# Patient Record
Sex: Male | Born: 1995 | Race: Black or African American | Hispanic: No | Marital: Single | Smoking: Never smoker
Health system: Southern US, Community
[De-identification: ages and names within clinical notes are randomized; demographics above are authoritative.]

## PROBLEM LIST (undated history)

## (undated) HISTORY — PX: LEG / ANKLE SOFT TISSUE BIOPSY: SUR148

---

## 2013-12-22 ENCOUNTER — Emergency Department (HOSPITAL_COMMUNITY): Payer: BC Managed Care – PPO

## 2013-12-22 ENCOUNTER — Emergency Department (HOSPITAL_COMMUNITY)
Admission: EM | Admit: 2013-12-22 | Discharge: 2013-12-22 | Disposition: A | Discharge status: Home / Self Care | Payer: BC Managed Care – PPO | Attending: Emergency Medicine | Admitting: Emergency Medicine

## 2013-12-22 ENCOUNTER — Encounter (HOSPITAL_COMMUNITY): Payer: Self-pay | Admitting: Emergency Medicine

## 2013-12-22 DIAGNOSIS — R059 Cough, unspecified: Secondary | ICD-10-CM

## 2013-12-22 DIAGNOSIS — Z880 Allergy status to penicillin: Secondary | ICD-10-CM | POA: Diagnosis not present

## 2013-12-22 DIAGNOSIS — M791 Myalgia: Secondary | ICD-10-CM | POA: Insufficient documentation

## 2013-12-22 DIAGNOSIS — R0789 Other chest pain: Secondary | ICD-10-CM | POA: Diagnosis not present

## 2013-12-22 DIAGNOSIS — R05 Cough: Secondary | ICD-10-CM | POA: Diagnosis not present

## 2013-12-22 MED ORDER — GUAIFENESIN 100 MG/5ML PO LIQD: 100.0000 mg | ORAL | Status: AC | PRN

## 2013-12-22 MED ORDER — ALBUTEROL SULFATE HFA 108 (90 BASE) MCG/ACT IN AERS
2.0000 | INHALATION_SPRAY | Freq: Once | RESPIRATORY_TRACT | Status: AC
  Administered 2013-12-22: 2 via RESPIRATORY_TRACT
  Filled 2013-12-22: qty 6.7

## 2013-12-22 MED ORDER — IBUPROFEN 800 MG PO TABS: 800.0000 mg | ORAL_TABLET | Freq: Three times a day (TID) | ORAL | Status: AC

## 2013-12-22 MED ORDER — METHOCARBAMOL 500 MG PO TABS: 500.0000 mg | ORAL_TABLET | Freq: Two times a day (BID) | ORAL | Status: AC

## 2013-12-22 NOTE — ED Provider Notes (Signed)
CSN: [id number]     Arrival date & time 12/22/13  2011 History  This chart was scribed for Jeannetta EllisJennifer L Wrangler Penning, PA-C working with Gerhard Munchobert Lockwood, MD by Evon Slackerrance Branch, ED Scribe. This patient was seen in room WTR7/WTR7 and the patient's care was started at 9:14 PM.      Chief Complaint  Patient presents with  . Cough   Patient is a 18 y.o. male presenting with cough. The history is provided by the patient. No language interpreter was used.  Cough Associated symptoms: myalgias    HPI Comments: Mitchell Sandoval is a 18 y.o. male who presents to the Emergency Department complaining of cough onset 3 weeks ago. Pt states that he has associated chest pain over the past week, associated with his cough. He states that he also had some intermittent dizziness today at work after coughing.  He states that he has tried C[employer] manageralk[employer] seltzer with no relief. He states that he has tried Mucinex that provided relief for his intitial symptoms of rhinorrhea and congestion. Denies fever, chills, vomiting, diarrhea or abdominal pain.   He states over the past few days he has been having intermittent cold sensation in his left arm and hand. He states that he first noticed the cold sensation when laying on his arm. He also notices it when doing movements with his left arm above his head.  Denies any falls or injury to the shoulder. He states that he is right hand dominant.   PERC negative. He has a scheduled follow-up appointment with his primary care physician on Tuesday.  History reviewed. No pertinent past medical history. History reviewed. No pertinent past surgical history. History reviewed. No pertinent family history. History  Substance Use Topics  . Smoking status: Never Smoker   . Smokeless tobacco: Not on file  . Alcohol Use: No    Review of Systems  Respiratory: Positive for cough and chest tightness.   Musculoskeletal: Positive for myalgias.  All other systems reviewed and are  negative.   Allergies  Penicillins  Home Medications   Prior to Admission medications   Medication Sig Start Date End Date Taking? Authorizing Provider  guaiFENesin (ROBITUSSIN) 100 MG/5ML liquid Take 5-10 mLs (100-200 mg total) by mouth every 4 (four) hours as needed for cough. 12/22/13   Zacharius Funari L Vada Swift, PA-C  ibuprofen (ADVIL,MOTRIN) 800 MG tablet Take 1 tablet (800 mg total) by mouth 3 (three) times daily. 12/22/13   Anushka Hartinger L Syd Manges, PA-C  methocarbamol (ROBAXIN) 500 MG tablet Take 1 tablet (500 mg total) by mouth 2 (two) times daily. 12/22/13   Nur Krasinski L Teneil Shiller, PA-C   Triage Vitals: BP 157/81 mmHg  Pulse 81  Temp(Src) 99 F (37.2 C) (Oral)  Resp 22  Ht 4\' 6"  (1.372 m)  Wt 235 lb (106.595 kg)  BMI 56.63 kg/m2  SpO2 100%  Physical Exam  Constitutional: He is oriented to person, place, and time. He appears well-developed and well-nourished. No distress.  HENT:  Head: Normocephalic and atraumatic.  Right Ear: Hearing, tympanic membrane, external ear and ear canal normal.  Left Ear: Hearing, tympanic membrane, external ear and ear canal normal.  Nose: Nose normal.  Mouth/Throat: Uvula is midline, oropharynx is clear and moist and mucous membranes are normal. No oropharyngeal exudate.  Eyes: Conjunctivae and EOM are normal.  Neck: Normal range of motion. Neck supple.  Cardiovascular: Normal rate, regular rhythm, normal heart sounds and intact distal pulses.   Pulmonary/Chest: Effort normal and breath sounds normal. No respiratory distress. He  exhibits tenderness.  Non-productive cough on examination.   Abdominal: Soft. There is no tenderness.  Musculoskeletal: Normal range of motion.  Negative Empty Can Test Negative Adson's maneuver ROM intact with Apley Scratch Test  Lymphadenopathy:    He has no cervical adenopathy.  Neurological: He is alert and oriented to person, place, and time. GCS eye subscore is 4. GCS verbal subscore is 5. GCS motor subscore  is 6.  Skin: Skin is warm and dry. He is not diaphoretic.  Psychiatric: He has a normal mood and affect.  Nursing note and vitals reviewed.   ED Course  Procedures (including critical care time) Medications  albuterol (PROVENTIL HFA;VENTOLIN HFA) 108 (90 BASE) MCG/ACT inhaler 2 puff (not administered)    DIAGNOSTIC STUDIES: Oxygen Saturation is 100% on RA, normal by my interpretation.    COORDINATION OF CARE: 9:20 PM-Discussed treatment plan which includes CXR with pt at bedside and pt agreed to plan.     Labs Review Labs Reviewed - No data to display  Imaging Review Dg Chest 2 View  12/22/2013   CLINICAL DATA:  Productive cough for the past 3 weeks. Upper left chest pain radiating to the back.  EXAM: CHEST  2 VIEW  COMPARISON:  None.  FINDINGS: The heart size and mediastinal contours are within normal limits. Both lungs are clear. The visualized skeletal structures are unremarkable.  IMPRESSION: Normal examination.   Electronically Signed   By: Gordan PaymentSteve  Reid M.D.   On: 12/22/2013 21:38     EKG Interpretation None      MDM   Final diagnoses:  Cough  Chest wall pain    Filed Vitals:   12/22/13 2106  BP: 157/81  Pulse: 81  Temp: 99 F (37.2 C)  Resp: 22   Afebrile, NAD, non-toxic appearing, AAOx4.   1) Cough/Chest Wall Pain: Pt CXR negative for acute infiltrate. Patients symptoms are consistent with URI, likely viral etiology. Discussed that antibiotics are not indicated for viral infections. Pt will be discharged with symptomatic treatment.  Verbalizes understanding and is agreeable with plan. Pt is hemodynamically stable & in NAD prior to dc.  2) Left arm: Neurovascularly intact. Normal sensation. No evidence of compartment syndrome. PE shows no instability, tenderness, or deformity of acromioclavicular and sternoclavicular joints, the cervical spine, glenohumeral joint, coracoid process, acromion, or scapula. Good shoulder strength during empty can test. Good ROM  during scratch test. No signs of impingement on Adson's maneuver. Will try anti-inflammatories and muscle relaxants. Advised patient to discuss continued symptoms with PCP at his follow-up appointment on Tuesday.  Return precautions were discussed. Patient is agreeable to plan. Patient stable at time of discharge.     I personally performed the services described in this documentation, which was scribed in my presence. The recorded information has been reviewed and is accurate.      Jeannetta EllisJennifer L Nils Thor, PA-C 12/22/13 2159  Gerhard Munchobert Lockwood, MD 12/22/13 [phone]

## 2013-12-22 NOTE — ED Notes (Signed)
Pt states he has had a cough for the past 3 weeks  Today at work he states he was having some dizziness  Pt states he has had some pain in his back for the past 2 days  Pt states the pain comes through to his chest and feels lke someone is poking him  Pt states at times he feels like he has something in his throat

## 2013-12-22 NOTE — Discharge Instructions (Signed)
Please follow up with your primary care physician in 1-2 days. If you do not have one please call the [facility] number listed above. Please take pain medication and/or muscle relaxants as prescribed and as needed for pain. Please do not drive on narcotic pain medication or on muscle relaxants. Please read all discharge instructions and return precautions.    Chest Wall Pain Chest wall pain is pain in or around the bones and muscles of your chest. It may take up to 6 weeks to get better. It may take longer if you must stay physically active in your work and activities.  CAUSES  Chest wall pain may happen on its own. However, it may be caused by:  A viral illness like the flu.  Injury.  Coughing.  Exercise.  Arthritis.  Fibromyalgia.  Shingles. HOME CARE INSTRUCTIONS   Avoid overtiring physical activity. Try not to strain or perform activities that cause pain. This includes any activities using your chest or your abdominal and side muscles, especially if heavy weights are used.  Put ice on the sore area.  Put ice in a plastic bag.  Place a towel between your skin and the bag.  Leave the ice on for 15-20 minutes per hour while awake for the first 2 days.  Only take over-the-counter or prescription medicines for pain, discomfort, or fever as directed by your caregiver. SEEK IMMEDIATE MEDICAL CARE IF:   Your pain increases, or you are very uncomfortable.  You have a fever.  Your chest pain becomes worse.  You have new, unexplained symptoms.  You have nausea or vomiting.  You feel sweaty or lightheaded.  You have a cough with phlegm (sputum), or you cough up blood. MAKE SURE YOU:   Understand these instructions.  Will watch your condition.  Will get help right away if you are not doing well or get worse. Document Released: 01/27/2005 Document Revised: 04/21/2011 Document Reviewed: 09/23/2010 [facility] Patient Information 2015 [place], [place].  This information is not intended to replace advice given to you by your health care provider. Make sure you discuss any questions you have with your health care provider. Muscle Cramps and Spasms Muscle cramps and spasms occur when a muscle or muscles tighten and you have no control over this tightening (involuntary muscle contraction). They are a common problem and can develop in any muscle. The most common place is in the calf muscles of the leg. Both muscle cramps and muscle spasms are involuntary muscle contractions, but they also have differences:   Muscle cramps are sporadic and painful. They may last a few seconds to a quarter of an hour. Muscle cramps are often more forceful and last longer than muscle spasms.  Muscle spasms may or may not be painful. They may also last just a few seconds or much longer. CAUSES  It is uncommon for cramps or spasms to be due to a serious underlying problem. In many cases, the cause of cramps or spasms is unknown. Some common causes are:   Overexertion.   Overuse from repetitive motions (doing the same thing over and over).   Remaining in a certain position for a long period of time.   Improper preparation, form, or technique while performing a sport or activity.   Dehydration.   Injury.   Side effects of some medicines.   Abnormally low levels of the salts and ions in your blood (electrolytes), especially potassium and calcium. This could happen if you are taking water pills (  diuretics) or you are pregnant.  Some underlying medical problems can make it more likely to develop cramps or spasms. These include, but are not limited to:   Diabetes.   Parkinson disease.   Hormone disorders, such as thyroid problems.   Alcohol abuse.   Diseases specific to muscles, joints, and bones.   Blood vessel disease where not enough blood is getting to the muscles.  HOME CARE INSTRUCTIONS   Stay well hydrated. Drink enough water and fluids  to keep your urine clear or pale yellow.  It may be helpful to massage, stretch, and relax the affected muscle.  For tight or tense muscles, use a warm towel, heating pad, or hot shower water directed to the affected area.  If you are sore or have pain after a cramp or spasm, applying ice to the affected area may relieve discomfort.  Put ice in a plastic bag.  Place a towel between your skin and the bag.  Leave the ice on for 15-20 minutes, 03-04 times a day.  Medicines used to treat a known cause of cramps or spasms may help reduce their frequency or severity. Only take over-the-counter or prescription medicines as directed by your caregiver. SEEK MEDICAL CARE IF:  Your cramps or spasms get more severe, more frequent, or do not improve over time.  MAKE SURE YOU:   Understand these instructions.  Will watch your condition.  Will get help right away if you are not doing well or get worse. Document Released: 07/19/2001 Document Revised: 05/24/2012 Document Reviewed: 01/14/2012 [facility] Patient Information 2015 [place], [place]. This information is not intended to replace advice given to you by your health care provider. Make sure you discuss any questions you have with your health care provider.

## 2014-07-03 ENCOUNTER — Encounter (HOSPITAL_COMMUNITY): Payer: Self-pay | Admitting: *Deleted

## 2014-07-03 ENCOUNTER — Emergency Department (HOSPITAL_COMMUNITY): Payer: BLUE CROSS/BLUE SHIELD

## 2014-07-03 ENCOUNTER — Emergency Department (HOSPITAL_COMMUNITY)
Admission: EM | Admit: 2014-07-03 | Discharge: 2014-07-03 | Disposition: A | Discharge status: Home / Self Care | Payer: BLUE CROSS/BLUE SHIELD | Attending: Emergency Medicine | Admitting: Emergency Medicine

## 2014-07-03 DIAGNOSIS — R079 Chest pain, unspecified: Secondary | ICD-10-CM

## 2014-07-03 DIAGNOSIS — R0789 Other chest pain: Secondary | ICD-10-CM | POA: Insufficient documentation

## 2014-07-03 DIAGNOSIS — Z880 Allergy status to penicillin: Secondary | ICD-10-CM | POA: Insufficient documentation

## 2014-07-03 DIAGNOSIS — Z791 Long term (current) use of non-steroidal anti-inflammatories (NSAID): Secondary | ICD-10-CM | POA: Insufficient documentation

## 2014-07-03 DIAGNOSIS — R05 Cough: Secondary | ICD-10-CM | POA: Insufficient documentation

## 2014-07-03 DIAGNOSIS — Z79899 Other long term (current) drug therapy: Secondary | ICD-10-CM | POA: Diagnosis not present

## 2014-07-03 LAB — CBC
HCT: 44.8 % (ref 39.0–52.0)
Hemoglobin: 15.2 g/dL (ref 13.0–17.0)
MCH: 29.9 pg (ref 26.0–34.0)
MCHC: 33.9 g/dL (ref 30.0–36.0)
MCV: 88 fL (ref 78.0–100.0)
Platelets: 280 10*3/uL (ref 150–400)
RBC: 5.09 MIL/uL (ref 4.22–5.81)
RDW: 12.9 % (ref 11.5–15.5)
WBC: 9.1 10*3/uL (ref 4.0–10.5)

## 2014-07-03 LAB — BASIC METABOLIC PANEL
Anion gap: 10 (ref 5–15)
BUN: 15 mg/dL (ref 6–20)
CO2: 25 mmol/L (ref 22–32)
Calcium: 9.6 mg/dL (ref 8.9–10.3)
Chloride: 105 mmol/L (ref 101–111)
Creatinine, Ser: 0.95 mg/dL (ref 0.61–1.24)
Glucose, Bld: 123 mg/dL — ABNORMAL HIGH (ref 65–99)
Potassium: 3.6 mmol/L (ref 3.5–5.1)
Sodium: 140 mmol/L (ref 135–145)

## 2014-07-03 LAB — I-STAT TROPONIN, ED: Troponin i, poc: 0 ng/mL (ref 0.00–0.08)

## 2014-07-03 NOTE — Discharge Instructions (Signed)

## 2014-07-03 NOTE — ED Notes (Signed)
PT states that he began having left sided chest pain last pm; pt states that the pain comes and goes and shoots from the left side of his chest through to his back; pt denies shortness of breath, nausea or any other symptoms; pt states that he does not think anything makes it better or worse; pt is sitting in triage in no acute distress

## 2014-07-03 NOTE — ED Provider Notes (Signed)
CSN: [id number]     Arrival date & time 07/03/14  2101 History   First MD Initiated Contact with Patient 07/03/14 2208     Chief Complaint  Patient presents with  . Chest Pain     (Consider location/radiation/quality/duration/timing/severity/associated sxs/prior Treatment) Patient is a 19 y.o. male presenting with chest pain.  Chest Pain Pain location:  L chest Pain quality: stabbing   Pain radiates to:  Upper back Pain severity:  Severe Onset quality:  Sudden Duration: a brief second. Timing:  Intermittent Progression:  Unchanged Chronicity:  New Context comment:  Began last night during a coughing fit.  He has had a lingering cough since having acute sinus symptoms several weeks ago.  Relieved by:  Nothing Worsened by:  Coughing Ineffective treatments:  None tried Associated symptoms: cough   Associated symptoms: no diaphoresis, no dizziness, no fever, no nausea, no near-syncope, no shortness of breath and not vomiting     History reviewed. No pertinent past medical history. Past Surgical History  Procedure Laterality Date  . Leg / ankle soft tissue biopsy     No family history on file. History  Substance Use Topics  . Smoking status: Never Smoker   . Smokeless tobacco: Not on file  . Alcohol Use: Yes     Comment: occ    Review of Systems  Constitutional: Negative for fever and diaphoresis.  Respiratory: Positive for cough. Negative for shortness of breath.   Cardiovascular: Positive for chest pain. Negative for near-syncope.  Gastrointestinal: Negative for nausea and vomiting.  Neurological: Negative for dizziness.  All other systems reviewed and are negative.     Allergies  Penicillins  Home Medications   Prior to Admission medications   Medication Sig Start Date End Date Taking? Authorizing Provider  guaiFENesin (ROBITUSSIN) 100 MG/5ML liquid Take 5-10 mLs (100-200 mg total) by mouth every 4 (four) hours as needed for cough. Patient not taking:  Reported on 07/03/2014 12/22/13   Francee Piccolo, PA-C  ibuprofen (ADVIL,MOTRIN) 800 MG tablet Take 1 tablet (800 mg total) by mouth 3 (three) times daily. Patient not taking: Reported on 07/03/2014 12/22/13   Francee Piccolo, PA-C  methocarbamol (ROBAXIN) 500 MG tablet Take 1 tablet (500 mg total) by mouth 2 (two) times daily. Patient not taking: Reported on 07/03/2014 12/22/13   Victorino Dike Piepenbrink, PA-C   BP 157/74 mmHg  Pulse 85  Temp(Src) 98 F (36.7 C) (Oral)  Resp 20  SpO2 100% Physical Exam  Constitutional: He is oriented to person, place, and time. He appears well-developed and well-nourished. No distress.  HENT:  Head: Normocephalic and atraumatic.  Mouth/Throat: Oropharynx is clear and moist.  Eyes: Conjunctivae are normal. Pupils are equal, round, and reactive to light. No scleral icterus.  Neck: Neck supple.  Cardiovascular: Normal rate, regular rhythm, normal heart sounds and intact distal pulses.   No murmur heard. Pulmonary/Chest: Effort normal and breath sounds normal. No stridor. No respiratory distress. He has no wheezes. He has no rales. He exhibits no tenderness.  Abdominal: Soft. He exhibits no distension. There is no tenderness.  Musculoskeletal: Normal range of motion. He exhibits no edema.       Thoracic back: He exhibits no tenderness and no bony tenderness.  Neurological: He is alert and oriented to person, place, and time.  Skin: Skin is warm and dry. No rash noted.  Psychiatric: He has a normal mood and affect. His behavior is normal.  Nursing note and vitals reviewed.   ED Course  Procedures (including  critical care time) Labs Review Labs Reviewed  BASIC METABOLIC PANEL - Abnormal; Notable for the following:    Glucose, Bld 123 (*)    All other components within normal limits  CBC  I-STAT TROPOININ, ED    Imaging Review Dg Chest 2 View  07/03/2014   CLINICAL DATA:  Left-sided chest pain radiating to back with productive cough for 2  weeks.  EXAM: CHEST  2 VIEW  COMPARISON:  12/22/2013  FINDINGS: Grossly unchanged cardiac silhouette and mediastinal contours. No focal parenchymal opacities. No pleural effusion or pneumothorax. No evidence of edema. No acute osseus abnormalities.  IMPRESSION: No acute cardiopulmonary disease. Specifically, no evidence of pneumonia.   Electronically Signed   By: Simonne ComeJohn  Watts M.D.   On: 07/03/2014 21:43  All radiology studies independently viewed by me.      EKG Interpretation   Date/Time:  Monday Jul 03 2014 21:10:41 EDT Ventricular Rate:  80 PR Interval:  135 QRS Duration: 97 QT Interval:  360 QTC Calculation: 415 R Axis:   86 Text Interpretation:  Sinus rhythm No old tracing to compare Confirmed by  [facility] CenterWOFFORD  MD, TREY ([id number]) on 07/03/2014 10:29:24 PM      MDM   Final diagnoses:  Left sided chest pain    Well appearing 19 yo male with atypical left chest pain.  Intermittent and very brief.  He feels pain slightly with certain twisting movements, but otherwise can't reproduce pain.   I suspect a muscle spasm or strain from his cough.  History inconsistent with ACS or PE.  PERC negative.  I have a very low suspicion for dissection based on history and he has normal and equal pulses bilaterally, no increased risk for dissection, and normal CXR.  Advised outpatient symptomatic treatment.  Have given return precautions.      Blake DivineJohn Tiffony Kite, MD 07/04/14 [phone]

## 2016-12-09 IMAGING — CR DG CHEST 2V
2 series · 2 of 2 positions shown · non-contrast
Comparison: 12/22/2013

CLINICAL DATA: Left-sided chest pain radiating to back with
productive cough for 2 weeks.

EXAM:
CHEST  2 VIEW

[w chest pa]
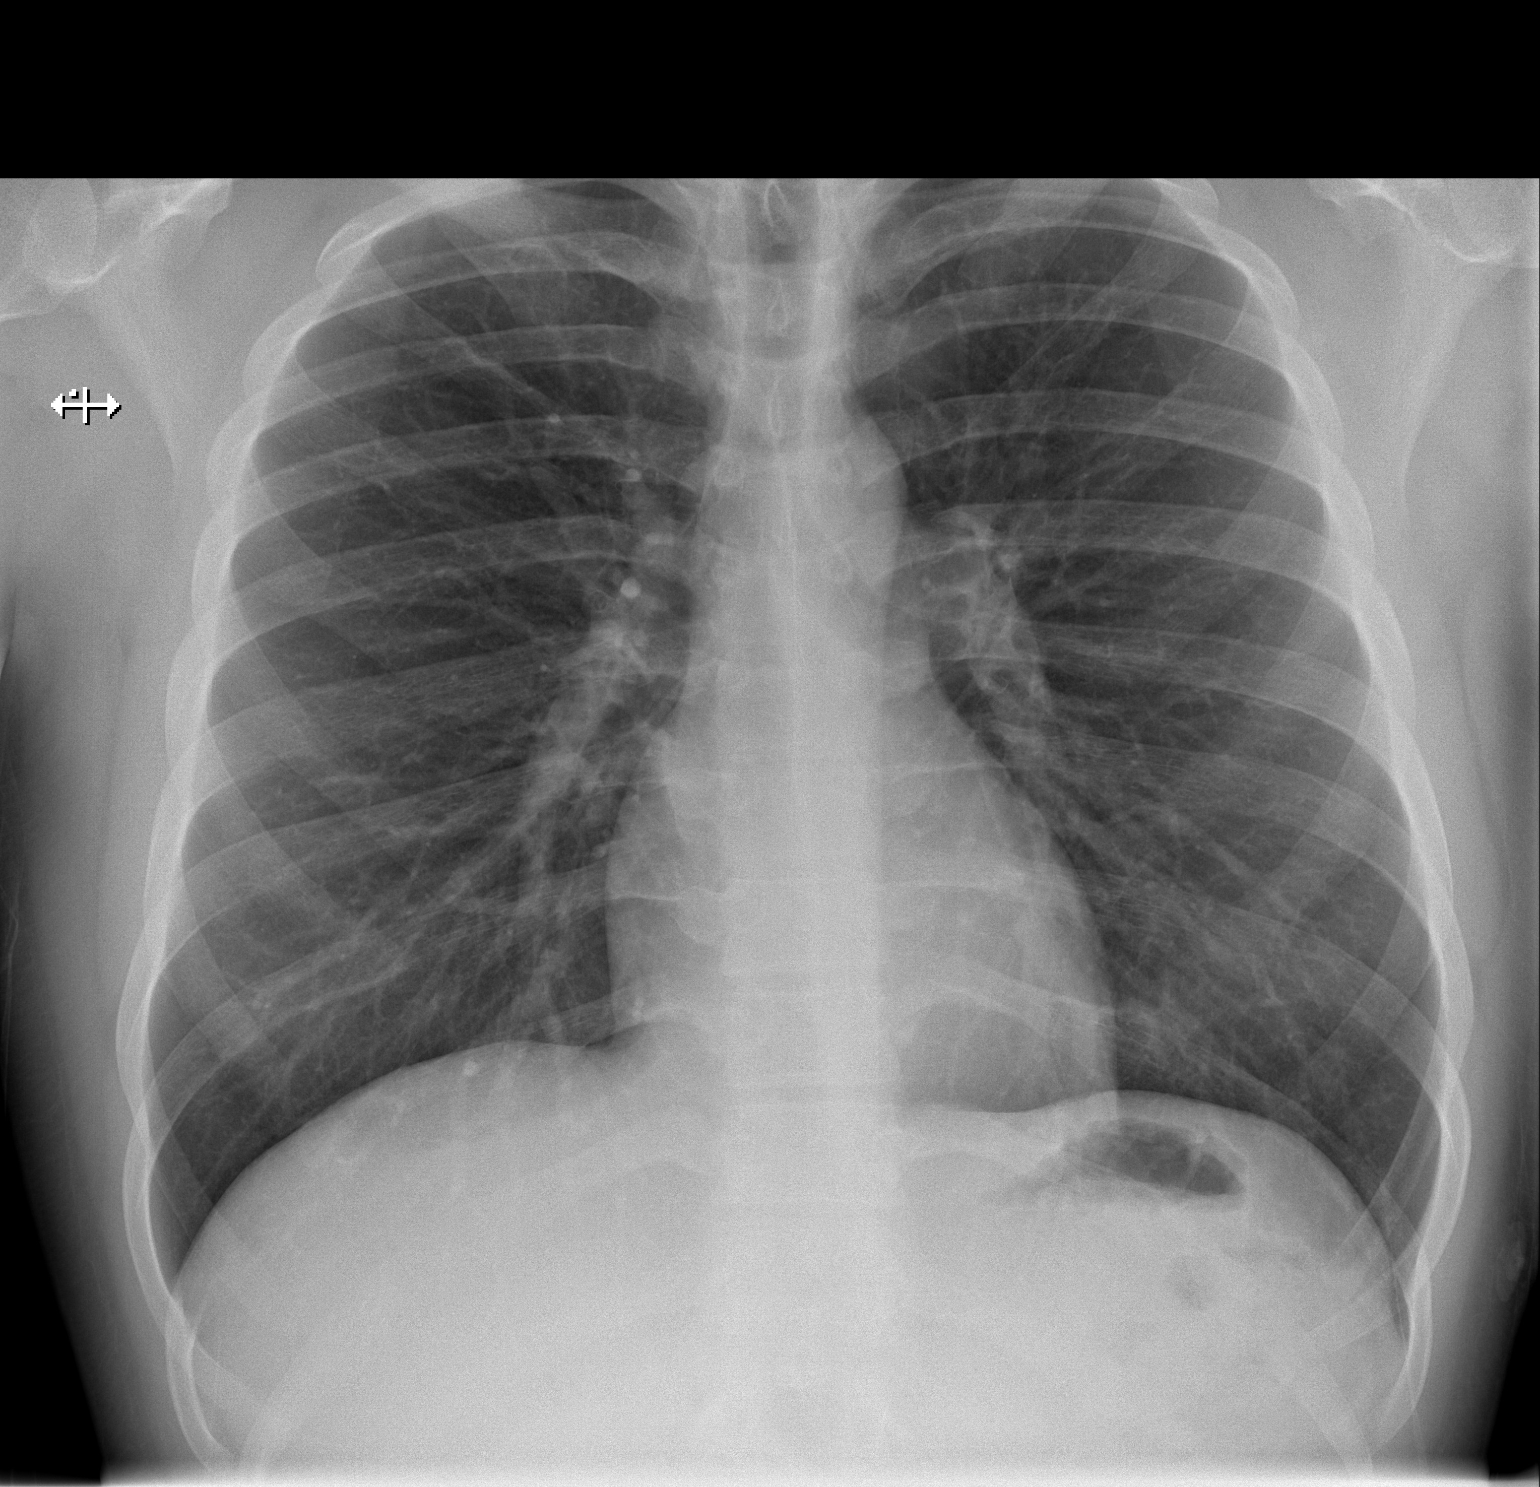

[w chest lat]
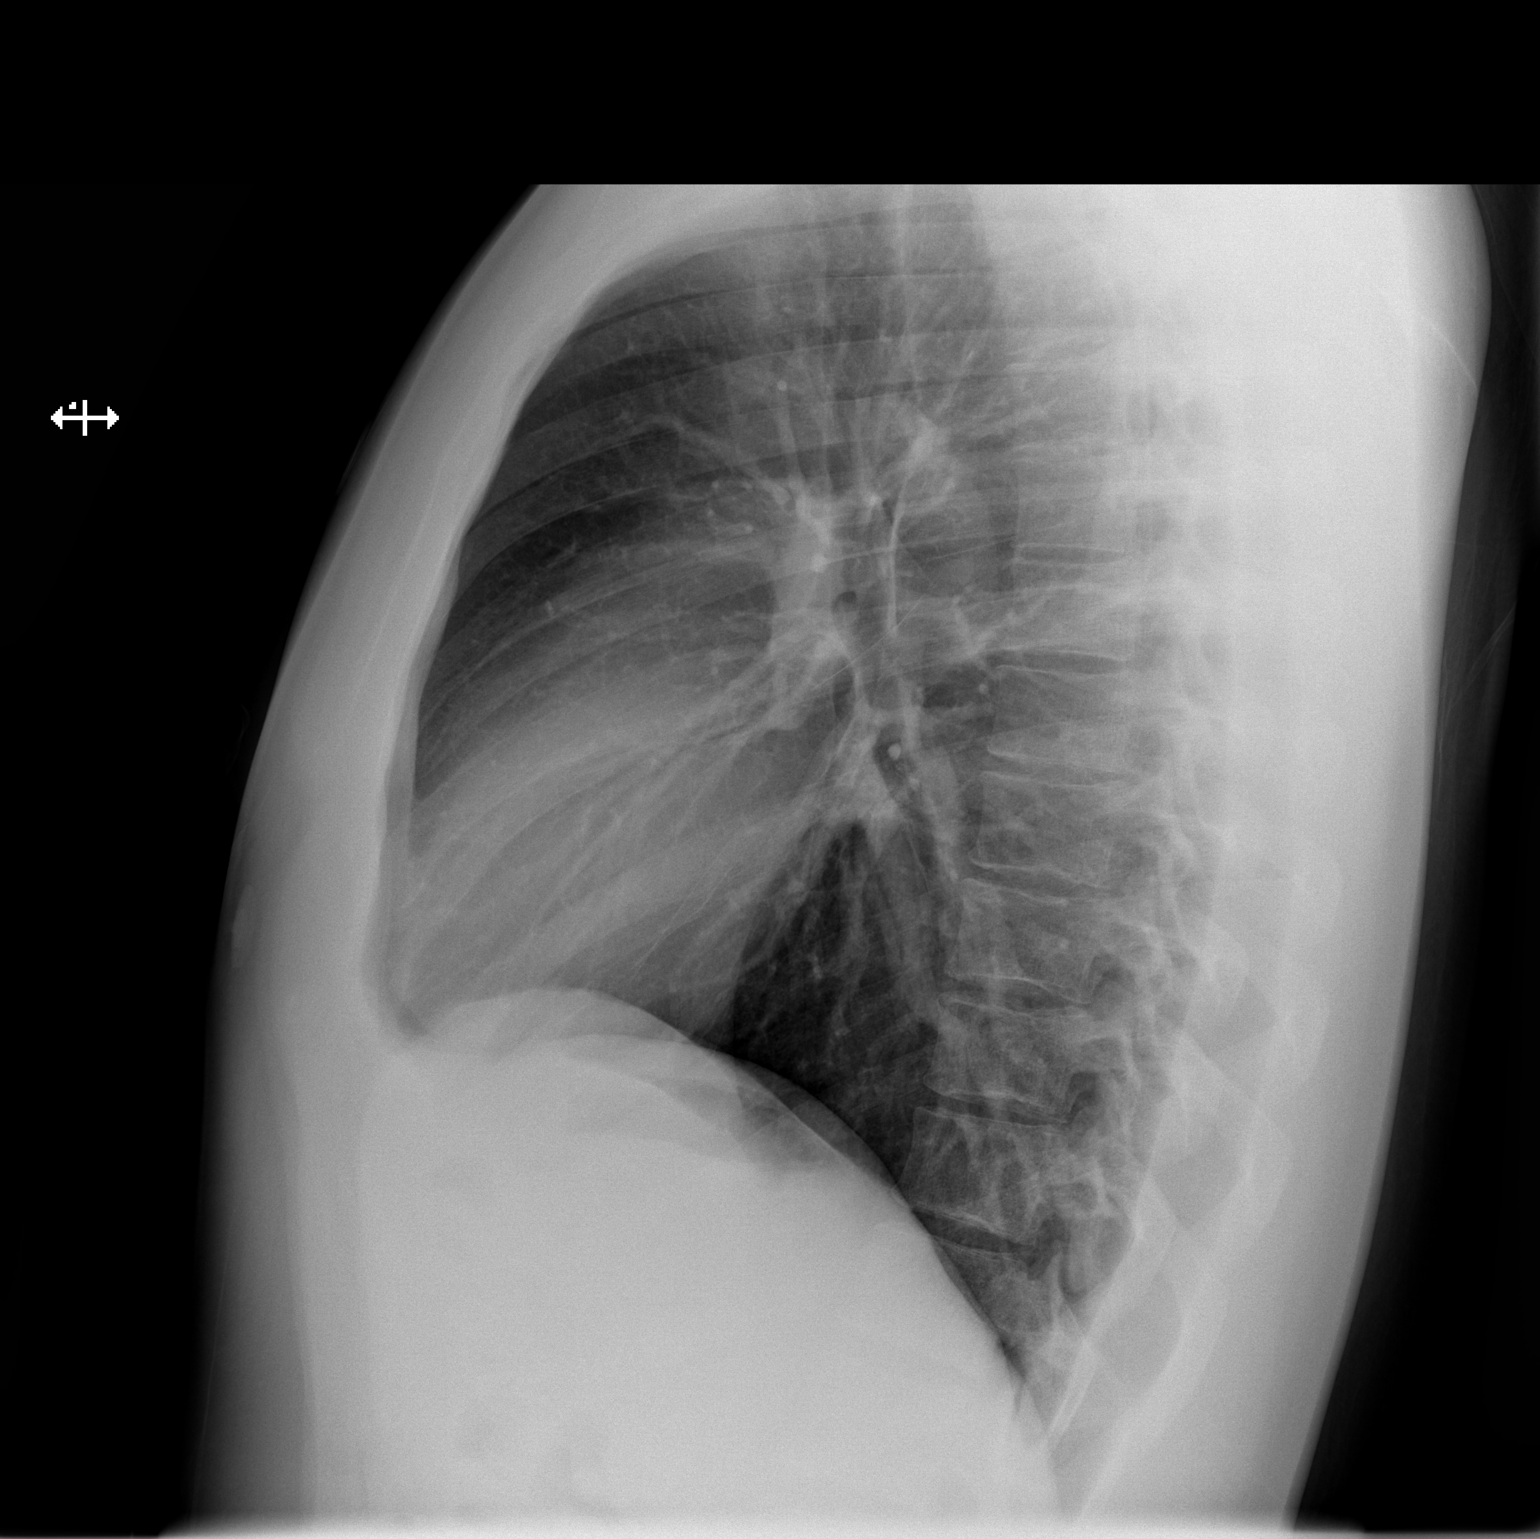

[2 of 2 positions shown; findings below may reference images not displayed]

FINDINGS: Grossly unchanged cardiac silhouette and mediastinal contours. No
focal parenchymal opacities. No pleural effusion or pneumothorax. No
evidence of edema. No acute osseus abnormalities.
IMPRESSION: No acute cardiopulmonary disease. Specifically, no evidence of
pneumonia.
